# Patient Record
Sex: Female | Born: 2005 | Race: White | Hispanic: No | Marital: Single | Smoking: Never smoker
Health system: Southern US, Community
[De-identification: ages and names within clinical notes are randomized; demographics above are authoritative.]

---
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
## 2024-01-04 DIAGNOSIS — F4322 Adjustment disorder with anxiety: Secondary | ICD-10-CM | POA: Diagnosis not present

## 2024-02-02 DIAGNOSIS — F4322 Adjustment disorder with anxiety: Secondary | ICD-10-CM | POA: Diagnosis not present

## 2024-02-22 DIAGNOSIS — Z3009 Encounter for other general counseling and advice on contraception: Secondary | ICD-10-CM | POA: Diagnosis not present

## 2024-02-23 DIAGNOSIS — Z30430 Encounter for insertion of intrauterine contraceptive device: Secondary | ICD-10-CM | POA: Diagnosis not present

## 2024-02-23 DIAGNOSIS — Z3202 Encounter for pregnancy test, result negative: Secondary | ICD-10-CM | POA: Diagnosis not present

## 2024-03-08 DIAGNOSIS — F4322 Adjustment disorder with anxiety: Secondary | ICD-10-CM | POA: Diagnosis not present

## 2024-03-19 DIAGNOSIS — Z30431 Encounter for routine checking of intrauterine contraceptive device: Secondary | ICD-10-CM | POA: Diagnosis not present

## 2024-03-19 DIAGNOSIS — R300 Dysuria: Secondary | ICD-10-CM | POA: Diagnosis not present

## 2024-04-02 DIAGNOSIS — Z113 Encounter for screening for infections with a predominantly sexual mode of transmission: Secondary | ICD-10-CM | POA: Diagnosis not present

## 2024-04-02 DIAGNOSIS — R102 Pelvic and perineal pain: Secondary | ICD-10-CM | POA: Diagnosis not present

## 2024-04-02 DIAGNOSIS — Z30431 Encounter for routine checking of intrauterine contraceptive device: Secondary | ICD-10-CM | POA: Diagnosis not present

## 2024-04-05 DIAGNOSIS — F4322 Adjustment disorder with anxiety: Secondary | ICD-10-CM | POA: Diagnosis not present

## 2024-04-17 DIAGNOSIS — F4322 Adjustment disorder with anxiety: Secondary | ICD-10-CM | POA: Diagnosis not present

## 2024-05-07 NOTE — Progress Notes (Unsigned)
 No chief complaint on file.  18 year old female presents to establish care and discuss anxiety. She just graduated from [employer] and will be attending [employer] in the Fall.    PMH, PSH, SH reviewed  No records available prior to visit. Records available in [facility]--  She saw GYN Dr. Arlyne Lame in March, had Kyleena  IUD placed 02/23/24. She was treated for a UTI 03/19/24 (E.coli) Went back for f/u 5/6, had some ongoing pelvic discomfort, wanted to ensure UTI adequately treated.   Repeat urine culture was normal.  Negative GC/CT   FH per those notes-- F BCC, MGF HTN, PGF CAD, DM    ROS:    PHYSICAL EXAM:  There were no vitals taken for this visit.      ASSESSMENT/PLAN:  Verify this is establish care and anxiety (and NOT CPE--just has that for visit type, not in reason for visit)  I didn't get any records to review previously--all I could see was recent GYN visits.  No pediatrician records. I don't see Men-B in immunizations.  Can you check [facility] and with pt to see if she ever got?  If not, should get series before going to college   PHQ9 and GAD7 Enter med--Kyleena  placed 02/23/24 (5 yrs end date) by Dr. Arlyne Lame   ?I see macrobid in outside med list--she likely took it for UTI, but is she taking a preventative one??

## 2024-05-08 ENCOUNTER — Ambulatory Visit (INDEPENDENT_AMBULATORY_CARE_PROVIDER_SITE_OTHER): Payer: Self-pay | Admitting: Family Medicine

## 2024-05-08 ENCOUNTER — Encounter: Payer: Self-pay | Admitting: Family Medicine

## 2024-05-08 VITALS — BP 122/80 | HR 88 | Ht 66.0 in | Wt 125.0 lb

## 2024-05-08 DIAGNOSIS — N390 Urinary tract infection, site not specified: Secondary | ICD-10-CM | POA: Diagnosis not present

## 2024-05-08 DIAGNOSIS — Z23 Encounter for immunization: Secondary | ICD-10-CM | POA: Diagnosis not present

## 2024-05-08 DIAGNOSIS — F4322 Adjustment disorder with anxiety: Secondary | ICD-10-CM | POA: Diagnosis not present

## 2024-05-08 NOTE — Patient Instructions (Addendum)
  We discussed starting the escitalopram (lexapro) at 1/2 tablet daily. After 1 week, if you are tolerating it well, you can increase to the full tablet. If you are still having side effects, you can stay at the 1/2 tablet for longer. Typically the full tablet is the effective strength (and some people need the higher dose). We can also prescribe alprazolam (xanax) to have on hand in case your anxiety gets significantly worse as this is started, or for as needed use in the future (ie for a panic attack). This is sedating and should not be taken with alcohol.  As an alternative, we discussed starting Buspar.  I encourage you to meet with [facility] regularly throughout the summer (possibly even weekly, especially as medications are started)  We would want to follow up in 4-6 weeks after starting either medication. Contact us  sooner if you cannot tolerate it.  Schedule a nurse visit for Meningitis B vaccine (Bexsero), which is a series of 2

## 2024-05-09 ENCOUNTER — Encounter: Payer: Self-pay | Admitting: Family Medicine

## 2024-05-09 DIAGNOSIS — F4322 Adjustment disorder with anxiety: Secondary | ICD-10-CM | POA: Insufficient documentation

## 2024-05-28 DIAGNOSIS — F4322 Adjustment disorder with anxiety: Secondary | ICD-10-CM | POA: Diagnosis not present

## 2024-06-18 DIAGNOSIS — F4322 Adjustment disorder with anxiety: Secondary | ICD-10-CM | POA: Diagnosis not present

## 2024-08-14 DIAGNOSIS — J069 Acute upper respiratory infection, unspecified: Secondary | ICD-10-CM | POA: Diagnosis not present

## 2024-08-14 DIAGNOSIS — J029 Acute pharyngitis, unspecified: Secondary | ICD-10-CM | POA: Diagnosis not present

## 2024-08-14 DIAGNOSIS — R051 Acute cough: Secondary | ICD-10-CM | POA: Diagnosis not present

## 2024-10-07 DIAGNOSIS — B2790 Infectious mononucleosis, unspecified without complication: Secondary | ICD-10-CM | POA: Diagnosis not present
